# Patient Record
Sex: Male | Born: 2000 | Hispanic: No | Marital: Single | Smoking: Never smoker
Health system: Southern US, Community
[De-identification: ages and names within clinical notes are randomized; demographics above are authoritative.]

---
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
## 2022-04-22 IMAGING — CR DG CHEST 2V
2 series · 2 of 2 positions shown · non-contrast
Comparison: None.

CLINICAL DATA: Fever and fatigue.

EXAM:
CHEST - 2 VIEW

[chest pa]
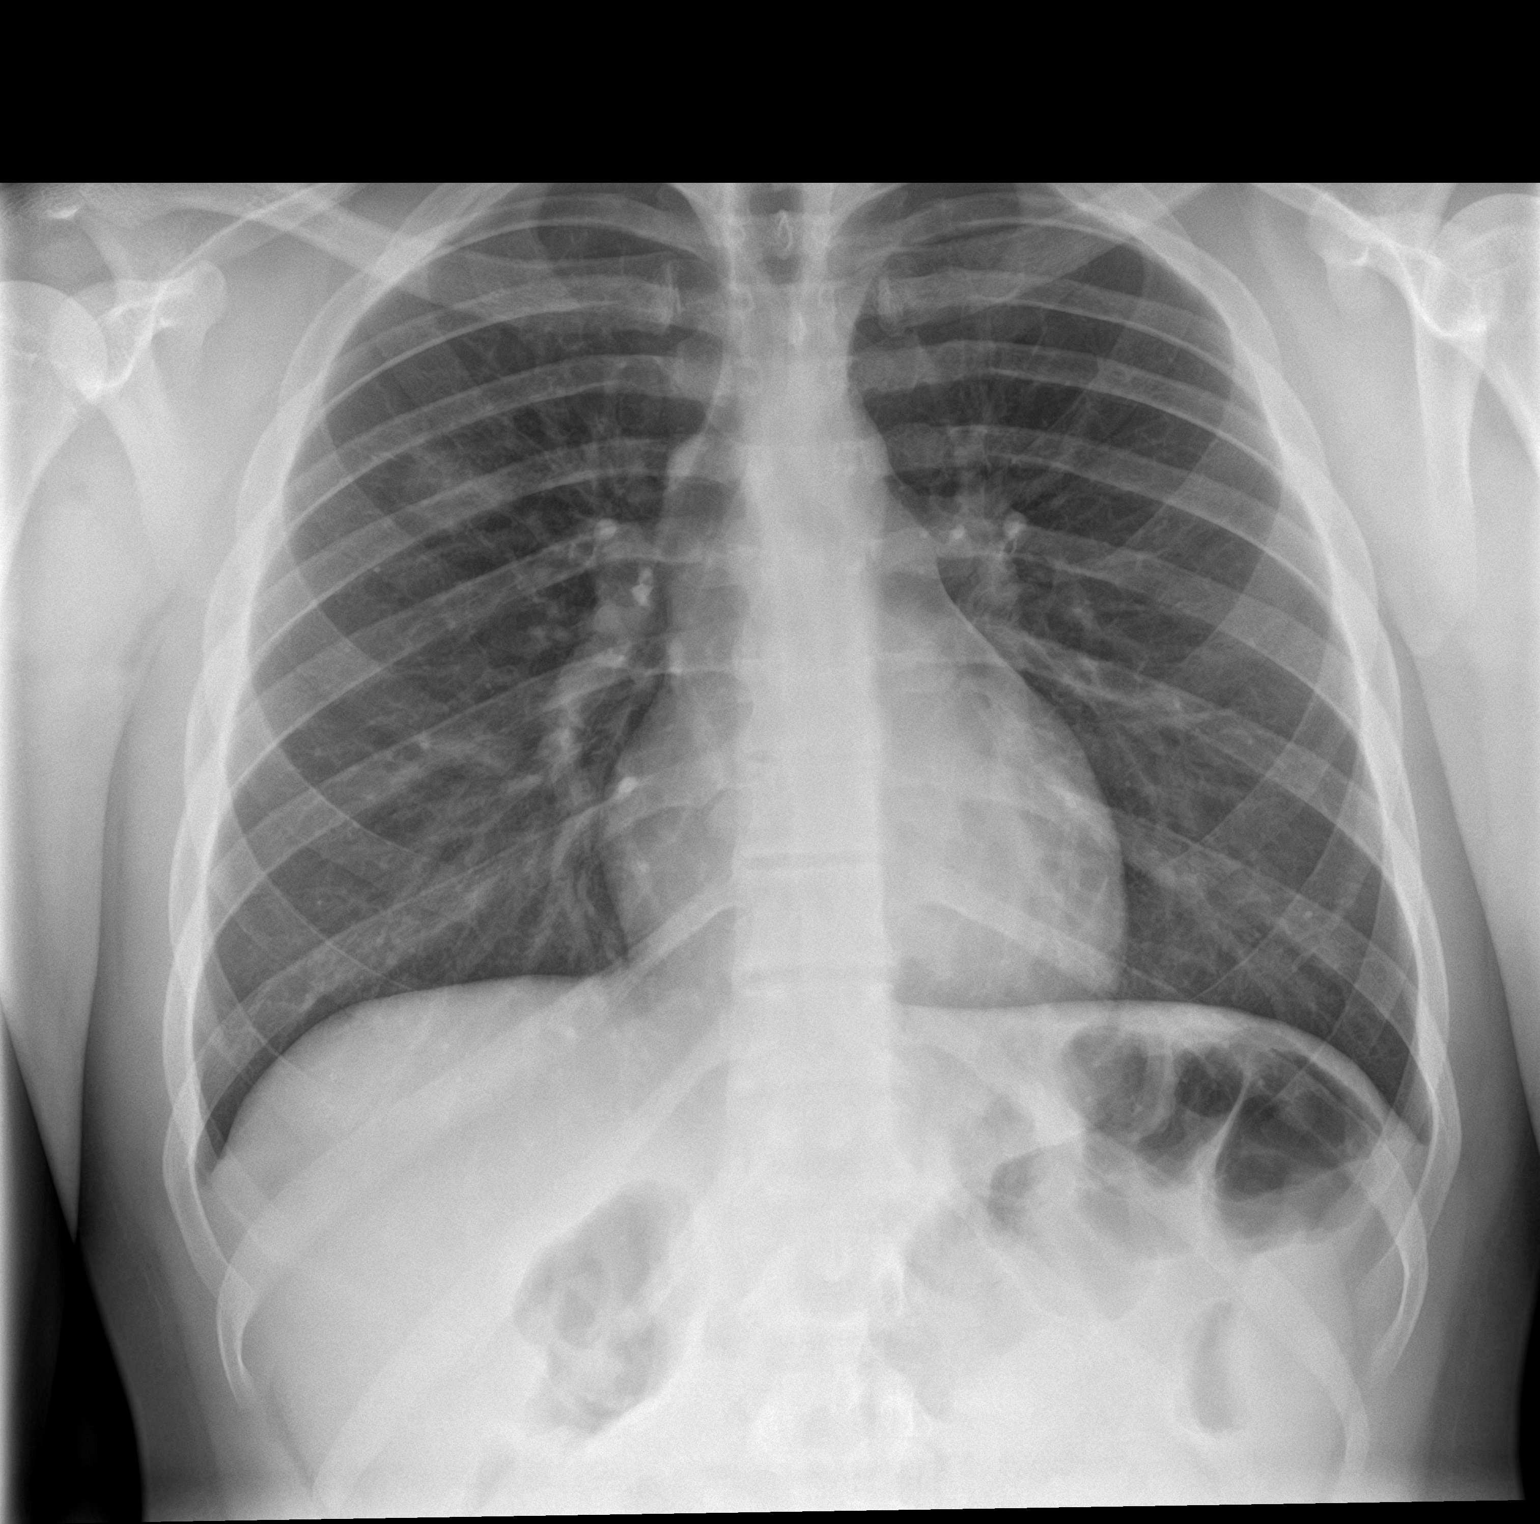

[chest lat]
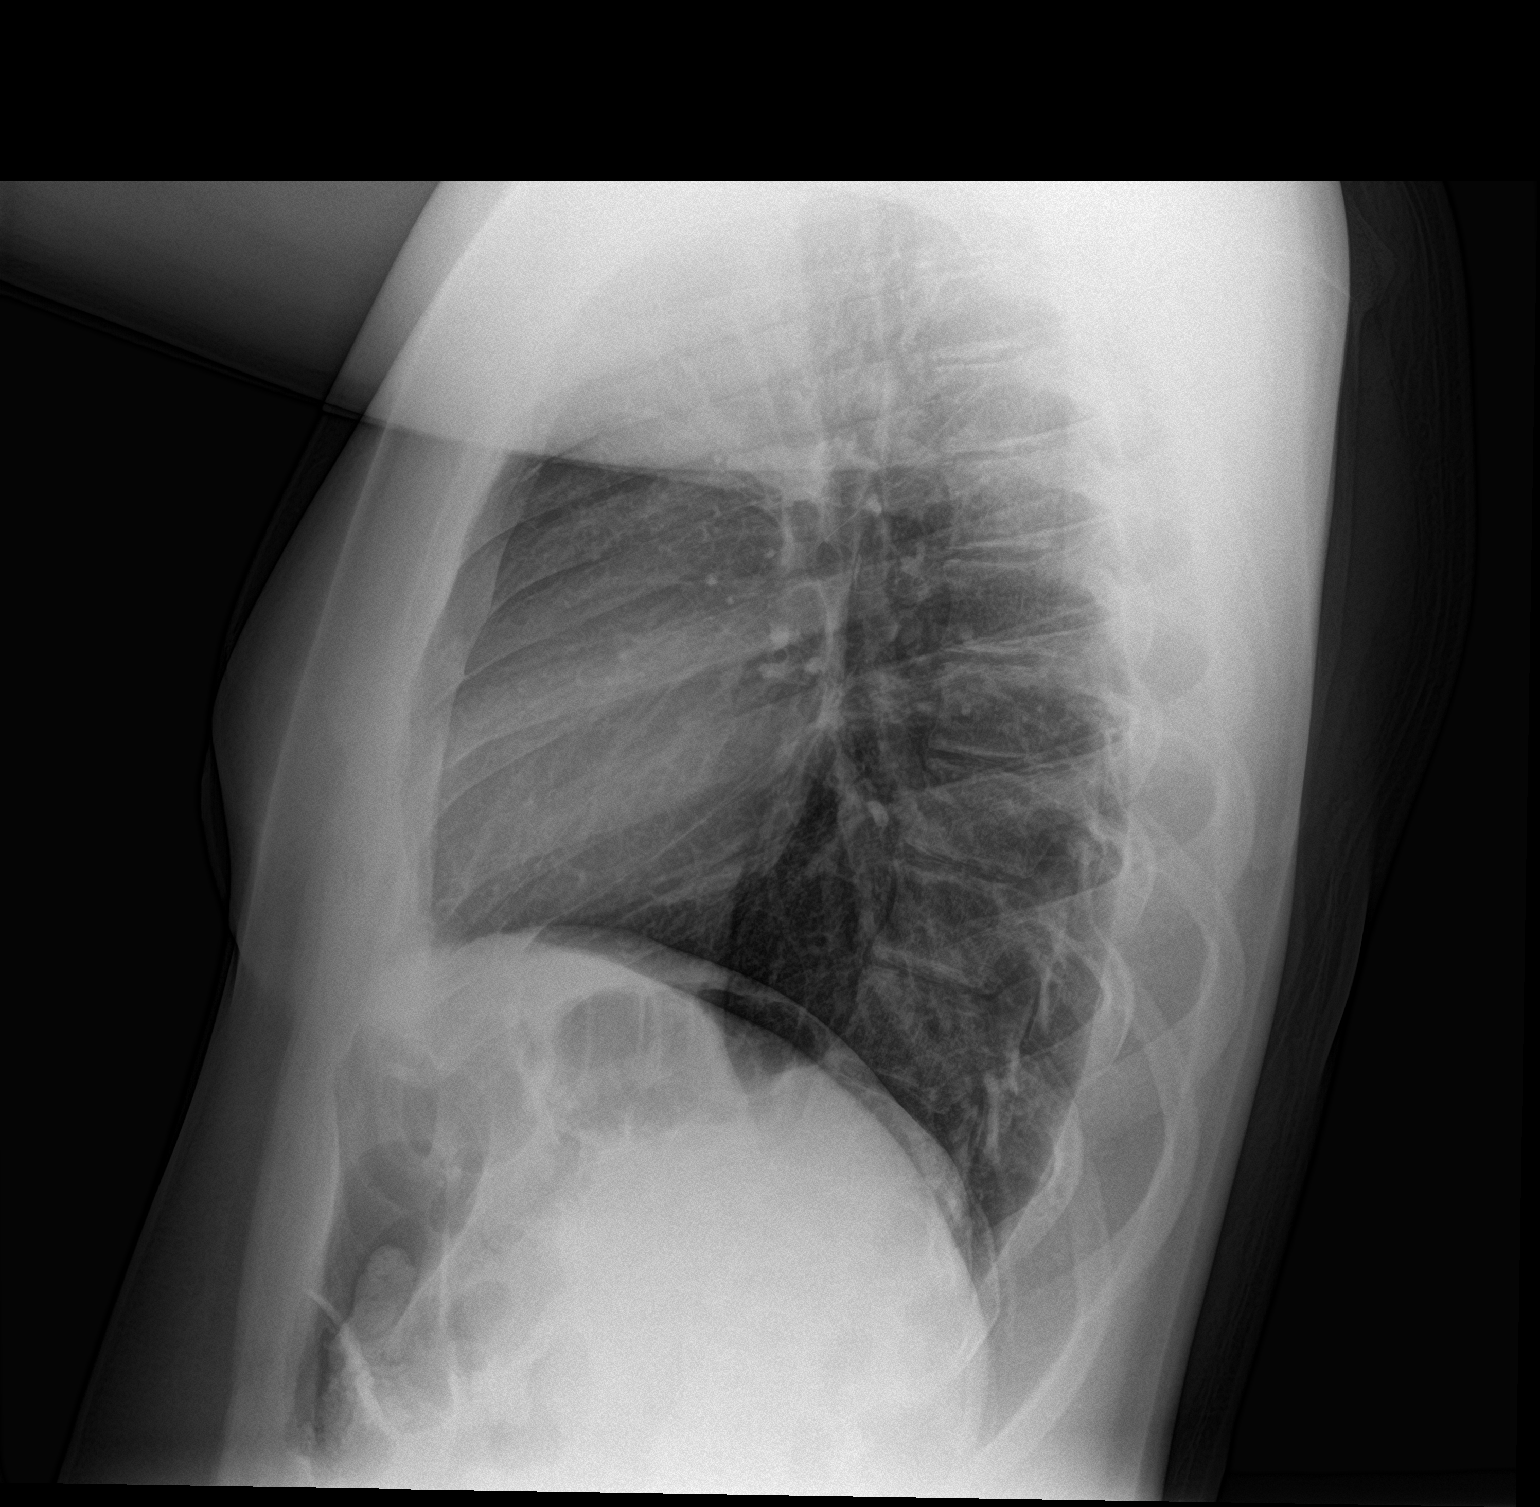

[2 of 2 positions shown; findings below may reference images not displayed]

FINDINGS: A small, ill-defined patchy opacity is seen within the right upper
lobe. There is no evidence of a pleural effusion or pneumothorax.
The heart size and mediastinal contours are within normal limits.
The visualized skeletal structures are unremarkable.
IMPRESSION: Mild right upper lobe infiltrate.

## 2024-01-11 ENCOUNTER — Emergency Department: Payer: Self-pay

## 2024-01-11 ENCOUNTER — Other Ambulatory Visit: Payer: Self-pay

## 2024-01-11 ENCOUNTER — Emergency Department
Admission: EM | Admit: 2024-01-11 | Discharge: 2024-01-11 | Disposition: A | Discharge status: Home / Self Care | Payer: Self-pay | Attending: Emergency Medicine | Admitting: Emergency Medicine

## 2024-01-11 DIAGNOSIS — R0781 Pleurodynia: Secondary | ICD-10-CM | POA: Insufficient documentation

## 2024-01-11 DIAGNOSIS — Y92410 Unspecified street and highway as the place of occurrence of the external cause: Secondary | ICD-10-CM | POA: Diagnosis not present

## 2024-01-11 DIAGNOSIS — M25511 Pain in right shoulder: Secondary | ICD-10-CM | POA: Diagnosis not present
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
